# Patient Record
Sex: Female | Born: 1965 | Race: White | Hispanic: No | Marital: Married
Health system: Southern US, Community
[De-identification: ages and names within clinical notes are randomized; demographics above are authoritative.]

---

## 2021-12-28 ENCOUNTER — Other Ambulatory Visit: Payer: Self-pay | Admitting: Family Medicine

## 2021-12-28 DIAGNOSIS — R928 Other abnormal and inconclusive findings on diagnostic imaging of breast: Secondary | ICD-10-CM

## 2022-01-17 ENCOUNTER — Other Ambulatory Visit: Payer: Self-pay | Admitting: Family Medicine

## 2022-01-17 ENCOUNTER — Ambulatory Visit
Admission: RE | Admit: 2022-01-17 | Discharge: 2022-01-17 | Disposition: A | Discharge status: Home / Self Care | Payer: BC Managed Care – PPO | Source: Ambulatory Visit | Attending: Family Medicine | Admitting: Family Medicine

## 2022-01-17 DIAGNOSIS — R928 Other abnormal and inconclusive findings on diagnostic imaging of breast: Secondary | ICD-10-CM

## 2022-01-24 ENCOUNTER — Inpatient Hospital Stay: Payer: BC Managed Care – PPO | Attending: Hematology and Oncology

## 2022-01-24 ENCOUNTER — Telehealth: Payer: Self-pay | Admitting: Genetic Counselor

## 2022-01-24 ENCOUNTER — Other Ambulatory Visit: Payer: Self-pay

## 2022-01-24 ENCOUNTER — Other Ambulatory Visit: Payer: Self-pay | Admitting: Genetic Counselor

## 2022-01-24 ENCOUNTER — Encounter: Payer: Self-pay | Admitting: Genetic Counselor

## 2022-01-24 DIAGNOSIS — Z803 Family history of malignant neoplasm of breast: Secondary | ICD-10-CM

## 2022-01-24 DIAGNOSIS — D0510 Intraductal carcinoma in situ of unspecified breast: Secondary | ICD-10-CM

## 2022-01-24 NOTE — Telephone Encounter (Signed)
Received call from [facility] at Dr. Baker Pierini office about patient needing STAT genetics given recent diagnosis of DCIS and FH of sister with breast cancer in 63s.  Called patient to briefly discuss genetic testing.  Blood draw appt set up for 1:30pm today at [facility].  Genetic counseling appt scheduled for 3/9 at 3pm.  TRF submitted to [facility] for [facility]CAPlus STAT Breast Panel.  Discussed TAT with patient and billing policies of [facility].

## 2022-01-31 ENCOUNTER — Encounter: Payer: Self-pay | Admitting: Genetic Counselor

## 2022-01-31 DIAGNOSIS — Z1379 Encounter for other screening for genetic and chromosomal anomalies: Secondary | ICD-10-CM | POA: Insufficient documentation

## 2022-02-03 ENCOUNTER — Telehealth: Payer: Self-pay | Admitting: Genetic Counselor

## 2022-02-03 NOTE — Telephone Encounter (Addendum)
Revealed negative breast cancer gene STAT results.  ? ? ?

## 2022-02-03 NOTE — Telephone Encounter (Deleted)
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   Arimidex x 5 years, managed by Dr Schwab  Low oncotype - 15    S/P 06/30/22 Left Breast Lumpectomy, sentinel lymph node biopsy     Established with rad onc, Dr Yashar    FINAL PATHOLOGIC DIAGNOSIS:   A: Left breast, lumpectomy:   -Invasive ductal carcinoma, histologic grade 3, rpT1cN0(sn), see synoptic report.   -Final resection margins, inclusive of specimen B  C, are negative for carcinoma.   B: Left breast, true deep margin, excision:   -Benign breast tissue.   C: Left breast, true margin at site of cancer, excision:   -Benign breast tissue.   D: Left axillary sentinel lymph nodes, excision:   -Two lymph nodes, negative for carcinoma (0/2).   BREAST CANCER: SYNOPTIC REPORT:   (CAP Version 4.8.0.0, posted December 2022, based on AJCC 8th Edition)   Specimen/Procedure: Excision (less than total mastectomy)   Lymph Node Sampling: Sentinel lymph node(s)   Specimen Laterality: Left   Tumor Focality: Unifocal   Invasive Tumor Type: Invasive carcinoma of no special type (ductal)   Invasive Tumor Size: 20 mm (maximum diameter)   Histologic Grade (Nottingham modification, Scarf-Bloom-Richardson System):   Grade 3   Nuclear Pleomorphism: Score 3   Mitotic rate: Score 2   Glandular/Tubular Differentiation: Score 3   Total score: 8/9   Lymphovascular Invasion: Absent   Resection Margins For Invasive Tumor:   Margins uninvolved by invasive carcinoma   Distance from closest margin: Widely clear -greater than 10 mm; including specimen(s) B  C, which is negative for carcinoma   Duct Carcinoma In Situ (DCIS): Not identified   Lobular Carcinoma In Situ (LCIS): Not identified   Lymph  Nodes Positive /Total Lymph Nodes Sampled: 0/2   Number of lymph nodes with macrometastases: 0   Number of lymph nodes with micrometastases:0   Number of Lymph Nodes with Isolated Tumor Cells: 0   Treatment Effect(s): Response to Presurgical (Neoadjuvant) Therapy in the   Breast:       Not applicable (no known presurgical therapy)   Pathologic Stage Classification:   r (recurrence)   Primary Tumor (Invasive Carcinoma) (pT):   pT1c:     Tumor >10 mm but less than or equal to 20 mm in greatest   dimension   Regional Lymph Nodes (pN):   (sn):     Sentinel node(s) evaluated.   pN0: No regional lymph node metastasis identified or ITCs only   Nipple: Not applicable   Skin/dermis: Invasive carcinoma directly invades into the dermis without   skin ulceration   Skeletal Muscle: Not applicable   Breast biomarker testing   -Testing was performed and results were reported on a prior biopsy   ([id number]); results are outlined again below   Estrogen receptor (ER): Positive:   Percentage of cells with nuclear positivity: 90%   Average intensity of staining: Strong   Progesterone receptor (PR): Positive:   Percentage of cells with nuclear positivity: 50%   Average intensity of staining: Strong   HER2 (IHC): Negative (score 1+, ASCO/CAP 2018)   HER2 FISH: Negative     Symptom-Focused Nursing Assessment: Pt returns to clinic   for post op check/path review. Started first partial breast XRT treatment this morning.      Fall Risk             Fall Risk Interventions:   {Fall Risk Interventions:37425}     Education provided to patient/caregiver on fall risk factors and precautions.      Nutrition        Nutrition Interventions:  {Nutrition Interventions:37463}     Education provided on importance of early nutrition intervention for improved symptom management and outcomes, with in-person or telehealth visit and family involvement.     Wellbeing Screening    Screening      No questionnaires available.                                 {Wellbeing Screening Interventions:37464}    {Oral Cancer-Directed Therapy:37465}    Pain        Pain interventions:    {Pain Interventions:37466}    Language: English           Nursing Education: Educated on plan of care, office contact, and AVS using teach-back method.     Teaching outcomes:      {Teaching Outcomes:37467}     Patient/caregiver expressed understanding of plan of care and questions answered to patient satisfaction     {Education Materials Provided:37468}    Plan of care: ***

## 2022-02-06 ENCOUNTER — Inpatient Hospital Stay: Payer: BC Managed Care – PPO | Attending: Hematology and Oncology | Admitting: Genetic Counselor

## 2022-02-06 ENCOUNTER — Other Ambulatory Visit: Payer: Self-pay

## 2022-02-06 DIAGNOSIS — Z803 Family history of malignant neoplasm of breast: Secondary | ICD-10-CM

## 2022-02-06 DIAGNOSIS — Z1379 Encounter for other screening for genetic and chromosomal anomalies: Secondary | ICD-10-CM

## 2022-02-06 DIAGNOSIS — D0512 Intraductal carcinoma in situ of left breast: Secondary | ICD-10-CM | POA: Diagnosis not present

## 2022-02-06 NOTE — Progress Notes (Incomplete)
REFERRING PROVIDER: Pollyann Samples, MD [address],  [place] [address]    PRIMARY PROVIDER:  Nicoletta Dress, MD  PRIMARY REASON FOR VISIT:  No diagnosis found.   HISTORY OF PRESENT ILLNESS:   Jean Rogers, a 56 y.o. female, was seen for a [facility] cancer genetics consultation at the request of Dr. Lilia Pro due to a personal and family history of cancer.  Jean Rogers presents to clinic today to discuss the possibility of a hereditary predisposition to cancer, to discuss genetic testing, and to further clarify her future cancer risks, as well as potential cancer risks for family members.   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 The treatment plan is pending.    RISK FACTORS:  Mammogram within the last year: yes Colonoscopy: yes;  most recent in January 2020 . Hysterectomy: yes; removed at 38; fibroids Ovaries intact: yes.  Menarche was at age 51 or 53.  First live birth at age 15.  OCP use for approximately 6 years.  HRT use: 0 years. Any excessive radiation exposure in the past: no Dermatology: as needed  FAMILY HISTORY:  We obtained a detailed, 4-generation family history.  Significant diagnoses are listed below: Family History  Problem Relation Age of Onset   Breast cancer Sister        dx 44s    Ms. Inzer is {aware/unaware} of previous family history of genetic testing for hereditary cancer risks. Patient's maternal ancestors are of *** descent, and paternal ancestors are of *** descent. There {IS NO:12509} reported Ashkenazi Jewish ancestry. There {IS NO:12509} known consanguinity.  GENETIC COUNSELING ASSESSMENT: Jean Rogers is a 56 y.o. female with a {Personal/family:20331} history of {cancer/polyps} which is somewhat suggestive of a {DISEASE} and predisposition to cancer given ***. We, therefore, discussed and recommended the following at today's visit.    DISCUSSION: We discussed that *** - ***% of *** is hereditary.  Most cases of *** associated with ***.  There are other genes that can be associated with hereditary *** cancer syndromes.  These include ***.  We discussed that testing is beneficial for several reasons including knowing how to follow individuals for their cancer risks, identifying whether potential treatment options *** would be beneficial, and understanding if other family members could be at risk for cancer and allowing them to undergo genetic testing.   We reviewed the characteristics, features and inheritance patterns of hereditary cancer syndromes. We also discussed genetic testing, including the appropriate family members to test, the process of testing, insurance coverage and turn-around-time for results. We discussed the implications of a negative, positive, carrier and/or variant of uncertain significant result. We recommended Jean Rogers pursue genetic testing for a panel that includes genes associated with *** cancer.   Jean Rogers  was offered a common hereditary cancer panel (47 genes) and an expanded pan-cancer panel (77 genes). Jean Rogers was informed of the benefits and limitations of each panel, including that expanded pan-cancer panels contain genes that do not have clear management guidelines at this point in time.  We also discussed that as the number of genes included on a panel increases, the chances of variants of uncertain significance increases.  After considering the benefits and limitations of each gene panel, Jean Rogers  elected to have *** through ***.   Based on Jean Rogers's {Personal/family:20331} history of cancer, she meets medical  criteria for genetic testing. Despite that she meets criteria, she may still have an out of pocket cost. We discussed that if her out of pocket cost for testing is over $100, the laboratory should contact her and discuss the self-pay prices and/or patient pay assistance programs.     ***We reviewed the characteristics, features and inheritance patterns of hereditary cancer syndromes. We also discussed genetic testing, including the appropriate family members to test, the process of testing, insurance coverage and turn-around-time for results. We discussed the implications of a negative, positive and/or variant of uncertain significant result. In order to get genetic test results in a timely manner so that Jean Rogers can use these genetic test results for surgical decisions, we recommended Jean Rogers pursue genetic testing for the ***. Once complete, we recommend Jean Rogers pursue reflex genetic testing to the *** gene panel.   Based on Jean Rogers's {Personal/family:20331} history of cancer, she meets medical criteria for genetic testing. Despite that she meets criteria, she may still have an out of pocket cost.   ***We discussed with Jean Rogers that the {Personal/family:20331} history does not meet insurance or NCCN criteria for genetic testing and, therefore, is not highly consistent with a familial hereditary cancer syndrome.  We feel she is at low risk to harbor a gene mutation associated with such a condition. Thus, we did not recommend any genetic testing, at this time, and recommended Jean Rogers continue to follow the cancer screening guidelines given by her primary healthcare provider.  ***In order to estimate her chance of having a {CA GENE:62345} mutation, we used statistical models ({GENMODELS:62370}) that consider her personal medical history, family history and ancestry.  Because each model is different, there can be a lot of variability in the risks they give.  Therefore, these numbers must be considered a rough range and not a precise risk of having a {CA GENE:62345} mutation.  These models estimate that she has approximately a ***-***% chance of having a mutation. Based on this assessment of her family and personal history, genetic testing {IS/ISNOT:34056}  recommended.  ***Based on the patient's {Personal/family:20331} history, a statistical model ({GENMODELS:62370}) was used to estimate her risk of developing {CA HX:54794}. This estimates her lifetime risk of developing {CA HX:54794} to be approximately ***%. This estimation does not consider any genetic testing results.  The patient's lifetime breast cancer risk is a preliminary estimate based on available information using one of several models endorsed by the [place] (ACS). The ACS recommends consideration of breast MRI screening as an adjunct to mammography for patients at high risk (defined as 20% or greater lifetime risk).   ***Jean Rogers has been determined to be at high risk for breast cancer.  Therefore, we recommend that annual screening with mammography and breast MRI be performed.  ***begin at age 43, or 10 years prior to the age of breast cancer diagnosis in a relative (whichever is earlier).  We discussed that Jean Rogers should discuss her individual situation with her referring physician and determine a breast cancer screening plan with which they are both comfortable.    PLAN: After considering the risks, benefits, and limitations, Jean Rogers provided informed consent to pursue genetic testing and the blood sample was sent to {Lab} Laboratories for analysis of the {test}. Results should be available within approximately {TAT TIME} weeks' time, at which point they will be disclosed by telephone to Ms. Bakken, as will any additional recommendations warranted by these results. Ms. Kinder will receive a summary  of her genetic counseling visit and a copy of her results once available. This information will also be available in Epic.   *** Despite our recommendation, Ms. Goedken did not wish to pursue genetic testing at today's visit. We understand this decision and remain available to coordinate genetic testing at any time in the future. We, therefore, recommend Ms. Portee continue to  follow the cancer screening guidelines given by her primary healthcare provider.  ***Based on Ms. Stearns's family history, we recommended her ***, who was diagnosed with *** at age ***, have genetic counseling and testing. Ms. Serrette will let us know if we can be of any assistance in coordinating genetic counseling and/or testing for this family member.   Lastly, we encouraged Ms. Pang to remain in contact with cancer genetics annually so that we can continuously update the family history and inform her of any changes in cancer genetics and testing that may be of benefit for this family.   Ms. Albee questions were answered to her satisfaction today. Our contact information was provided should additional questions or concerns arise. Thank you for the referral and allowing [place] to share in the care of your patient.   Keiara Sneeringer M. Joette Catching, [place], [facility] Genetic Counselor [email or web address] (P) [phone]  The patient was seen for a total of *** minutes in face-to-face genetic counseling.  ***The was accompanied by ***.  ***The patient was seen alone.  Drs. Lindi Adie and/or Burr Medico were available to discuss this case as needed.    _______________________________________________________________________ For Office Staff:  Number of people involved in session: *** Was an Intern/ student involved with case: {YES/NO:63}

## 2022-02-11 ENCOUNTER — Encounter: Payer: Self-pay | Admitting: Genetic Counselor

## 2022-02-11 DIAGNOSIS — D0512 Intraductal carcinoma in situ of left breast: Secondary | ICD-10-CM | POA: Insufficient documentation

## 2022-02-11 DIAGNOSIS — Z803 Family history of malignant neoplasm of breast: Secondary | ICD-10-CM | POA: Insufficient documentation

## 2022-02-11 HISTORY — DX: Intraductal carcinoma in situ of left breast: D05.12

## 2022-02-11 HISTORY — DX: Family history of malignant neoplasm of breast: Z80.3

## 2022-02-14 ENCOUNTER — Telehealth: Payer: Self-pay | Admitting: Genetic Counselor

## 2022-02-14 ENCOUNTER — Ambulatory Visit: Payer: Self-pay | Admitting: Genetic Counselor

## 2022-02-14 DIAGNOSIS — Z1379 Encounter for other screening for genetic and chromosomal anomalies: Secondary | ICD-10-CM

## 2022-02-14 DIAGNOSIS — D0512 Intraductal carcinoma in situ of left breast: Secondary | ICD-10-CM

## 2022-02-14 DIAGNOSIS — Z803 Family history of malignant neoplasm of breast: Secondary | ICD-10-CM

## 2022-02-14 NOTE — Telephone Encounter (Signed)
Revealed negative pan-cancer panel.    

## 2022-02-14 NOTE — Progress Notes (Signed)
HPI:   ?Ms. Tello was previously seen in the [facility] clinic due to a personal and family history of cancer and concerns regarding a hereditary predisposition to cancer. Please refer to our prior cancer genetics clinic note for more information regarding our discussion, assessment and recommendations, at the time. Ms. Lamarca recent genetic test results were disclosed to her, as were recommendations warranted by these results. These results and recommendations are discussed in more detail below. ? ?CANCER HISTORY:  ?Oncology History  ?Ductal carcinoma in situ (DCIS) of left breast  ?02/11/2022 Initial Diagnosis  ? Ductal carcinoma in situ (DCIS) of left breast ?  ?02/11/2022 Genetic Testing  ? Negative hereditary cancer genetic testing: no pathogenic variants detected in [facility]CAPl[facility] Panel and CustomNext-Cancer +RNAinsight Panel.  Report dates are January 30, 2022 and February 11, 2022.  ? ?The BRCAplus panel offered by [employer] and includes sequencing and deletion/duplication analysis for the following 8 genes: ATM, BRCA1, BRCA2, CDH1, CHEK2, PALB2, PTEN, and TP53.  The CustomNext-Cancer +RNAinsight Panel includes analysis of the following 39 genes: APC, ATM, BAP1, BARD1, BMPR1A, BRCA1, BRCA2, BRIP1, CDH1, CDK4, CDKN2A, CHEK2, DICER1, MLH1,MSH2, MSH6, MUTYH, NBN, NF1, NTHL1, PALB2, PMS2, POT1, PTEN, RAD51C, RAD51D, RECQL, SMAD4, SMARCA4, STK11 and TP53 (sequencing and deletion/duplication); AXIN2, HOXB13, MITF, MSH3, POLD1 and POLE (sequencing only); EPCAM and GREM1 ?(deletion/duplication only). RNA data is routinely analyzed for use in variant interpretation for all genes. ?  ? ? ?FAMILY HISTORY:  ?We obtained a detailed, 4-generation family history.  Significant diagnoses are listed below: ?Family History  ?Problem Relation Age of Onset  ? Throat cancer Mother   ?     dx 61s  ? Head & neck cancer Father   ?     dx early 73s  ? Breast cancer Sister   ?     dx 34s  ? Cancer Sister 62  ?      salivary gland  ? Brain cancer Maternal Aunt   ?     dx 39s-80s  ? ? ? ?Ms. Fogarty is unaware of previous family history of genetic testing for hereditary cancer risks.  She reports that her sister may have had negative genetic testing previously; however, no report was available for review today. Other affected family members were unavailable for testing at this point in time. There is no reported Ashkenazi Jewish ancestry. There is no known consanguinity. ? ?GENETIC TEST RESULTS:  ?The [facility] CustomNext-Cancer +RNAinsight Panel found no pathogenic mutations. The CustomNext-Cancer+RNAinsight panel offered by [facility] includes sequencing and rearrangement analysis for the following 39 genes:  APC, ATM, BAP1, BARD1, BMPR1A, BRCA1, BRCA2, BRIP1, CDH1, CDK4, CDKN2A, CHEK2, DICER1, MLH1,MSH2, MSH6, MUTYH, NBN, NF1, NTHL1, PALB2, PMS2, POT1, PTEN, RAD51C, RAD51D, RECQL, SMAD4, SMARCA4, STK11 and TP53 (sequencing and deletion/duplication); AXIN2, HOXB13, MITF, MSH3, POLD1 and POLE (sequencing only); EPCAM and GREM1 (deletion/duplication only).  RNA data is routinely analyzed for use in variant interpretation for all genes. ? ?The test report has been scanned into EPIC and is located under the Molecular Pathology section of the Results Review tab.  A portion of the result report is included below for reference. Genetic testing reported out on February 11, 2022. ? ? ? ?Even though a pathogenic variant was not identified, possible explanations for the cancer in the family may include: ?There may be no hereditary risk for cancer in the family. The cancers in Ms. Bettinger and/or her family may be sporadic/familial or due to other genetic and environmental factors. ?  There may be a gene mutation in one of these genes that current testing methods cannot detect but that chance is small. ?There could be another gene that has not yet been discovered, or that we have not yet tested, that is responsible for the cancer diagnoses in the  family.  ?It is also possible there is a hereditary cause for the cancer in the family that Ms. Montini did not inherit. ? ?Therefore, it is important to remain in touch with cancer genetics in the future so that we can continue to offer Ms. Mckeag the most up to date genetic testing.  ? ?ADDITIONAL GENETIC TESTING:  ?We discussed with Ms. Pellicano that her genetic testing was fairly extensive.  If there are genes identified to increase cancer risk that can be analyzed in the future, we would be happy to discuss and coordinate this testing at that time.   ? ?CANCER SCREENING RECOMMENDATIONS:  ?Ms. Hinks's test result is considered negative (normal).  This means that we have not identified a hereditary cause for her personal history of cancer at this time.  ? ?An individual's cancer risk and medical management are not determined by genetic test results alone. Overall cancer risk assessment incorporates additional factors, including personal medical history, family history, and any available genetic information that may result in a personalized plan for cancer prevention and surveillance. Therefore, it is recommended she continue to follow the cancer management and screening guidelines provided by her oncology and primary healthcare provider. ? ?RECOMMENDATIONS FOR FAMILY MEMBERS:   ?Since she did not inherit a identifiable mutation in a cancer predisposition gene included on this panel, her children could not have inherited a known mutation from her in one of these genes. ?Individuals in this family might be at some increased risk of developing cancer, over the general population risk, due to the family history of cancer.  Individuals in the family should notify their providers of the family history of cancer. We recommend women in this family have a yearly mammogram beginning at age 33, or 103 years younger than the earliest onset of cancer, an annual clinical breast exam, and perform monthly breast self-exams.  .   ? ? ?FOLLOW-UP:  ?Lastly, we discussed with Ms. Froberg that cancer genetics is a rapidly advancing field and it is possible that new genetic tests will be appropriate for her and/or her family members in the future. We encouraged her to remain in contact with cancer genetics on an annual basis so we can update her personal and family histories and let her know of advances in cancer genetics that may benefit this family.  ? ?Our contact number was provided. Ms. Shivley questions were answered to her satisfaction, and she knows she is welcome to call us at anytime with additional questions or concerns.  ? ?Dezaree Tracey M. Joette Catching, [place], [place] ?Genetic Counselor ?[email or web address] ?(P) [phone] ? ?

## 2023-02-20 IMAGING — MG MM BREAST BX W LOC DEV 1ST LESION IMAGE BX SPEC STEREO GUIDE*L*
8 of 11 series · 8 of 19 positions shown · non-contrast
Comparison: Previous exams.
COMPARISON: Previous exams.

Addendum:
CLINICAL DATA: Patient presents for stereotactic core needle biopsy
of a possible architectural distortion in the posterior, slightly
medial aspect the left breast.

EXAM:
LEFT BREAST STEREOTACTIC CORE NEEDLE BIOPSY

[L (1 of 8)]
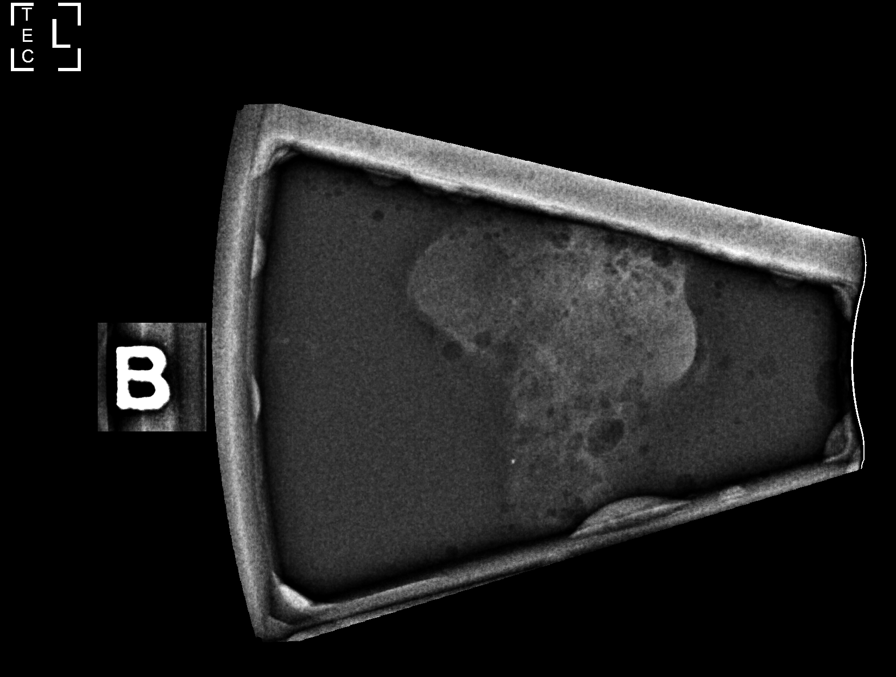

[L (2 of 8)]
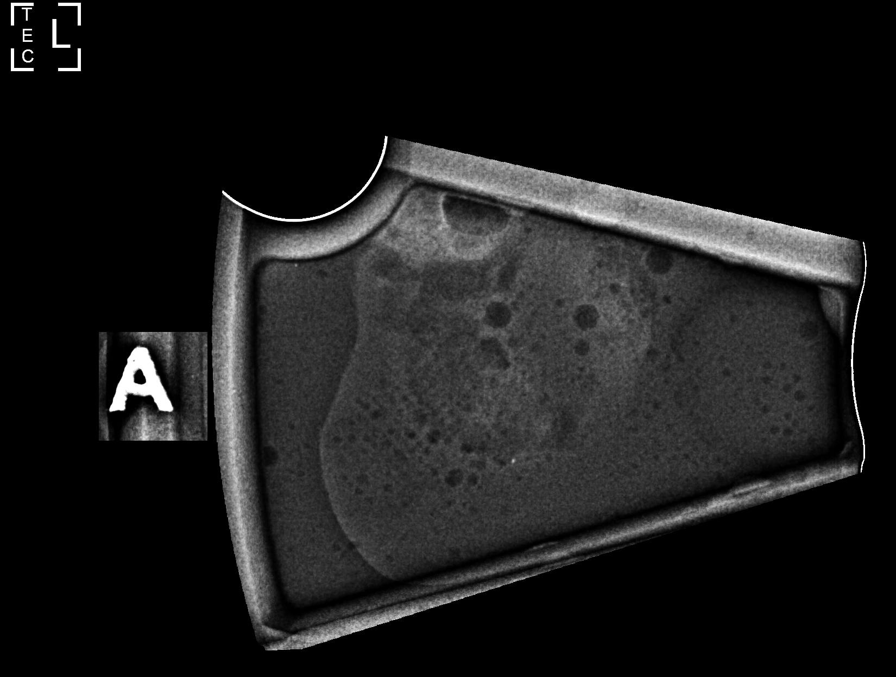

[L (3 of 8)]
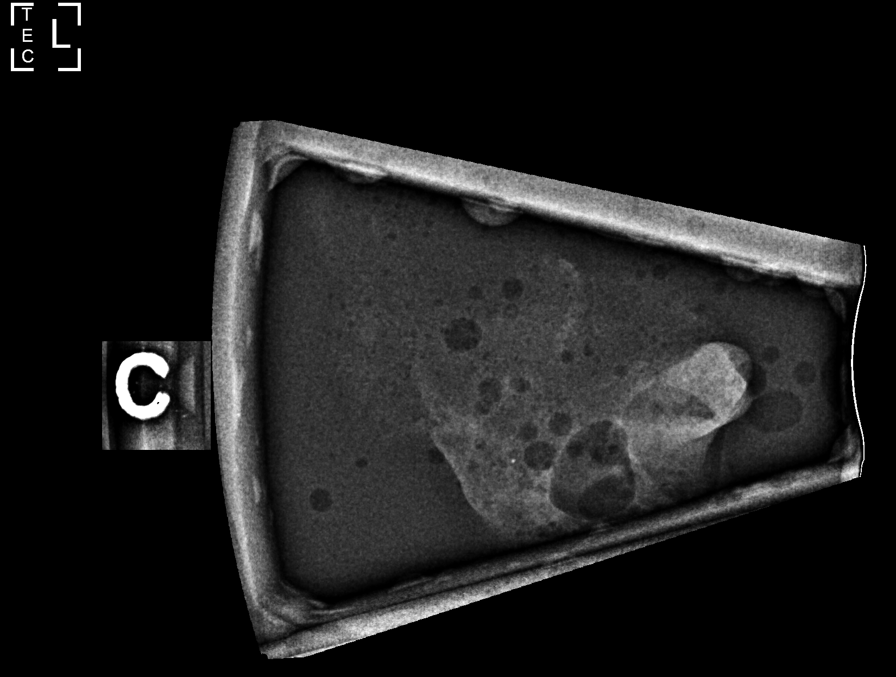

[L (4 of 8)]
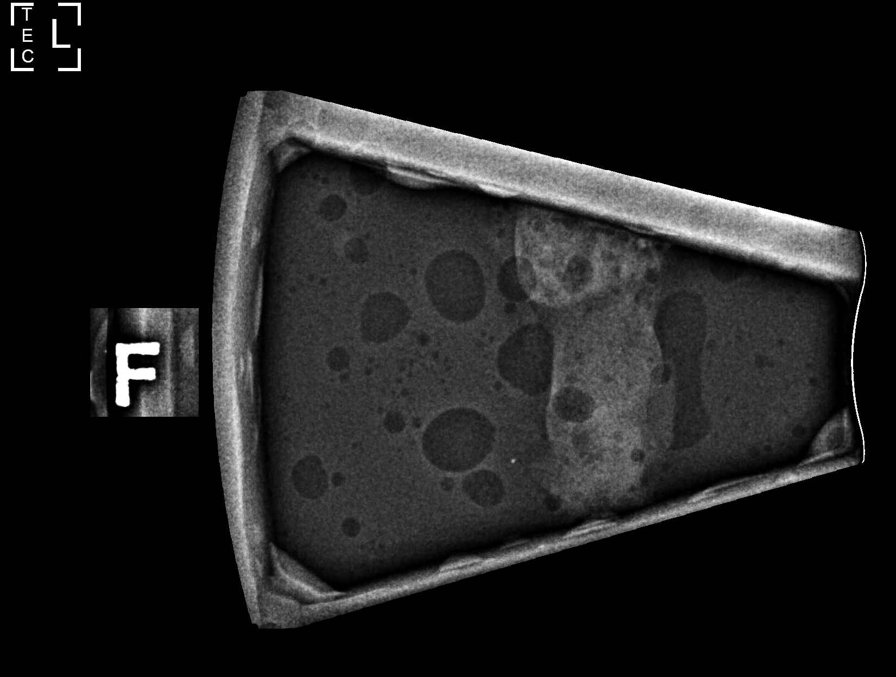

[L (5 of 8)]
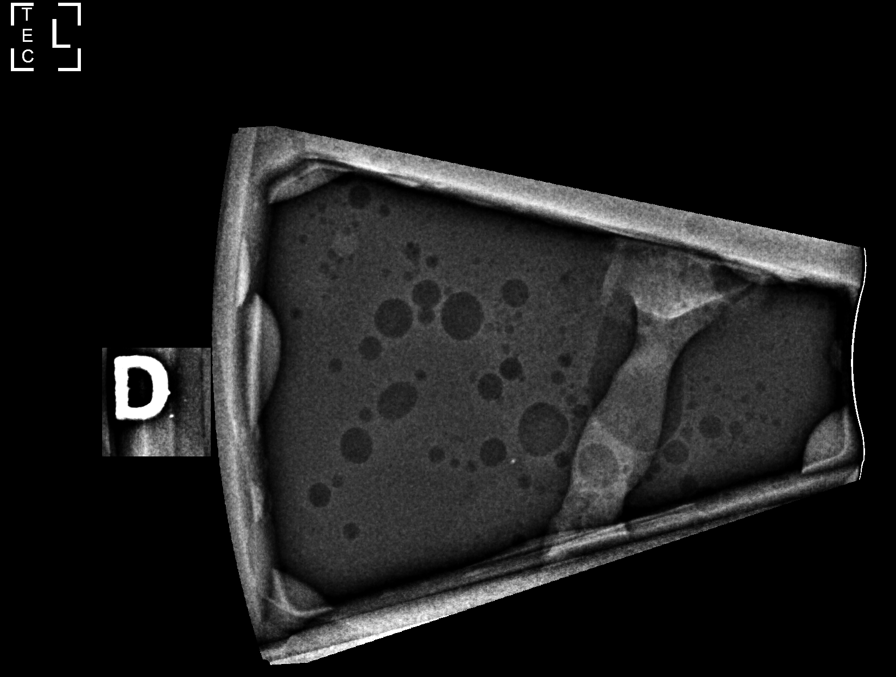

[L (6 of 8)]
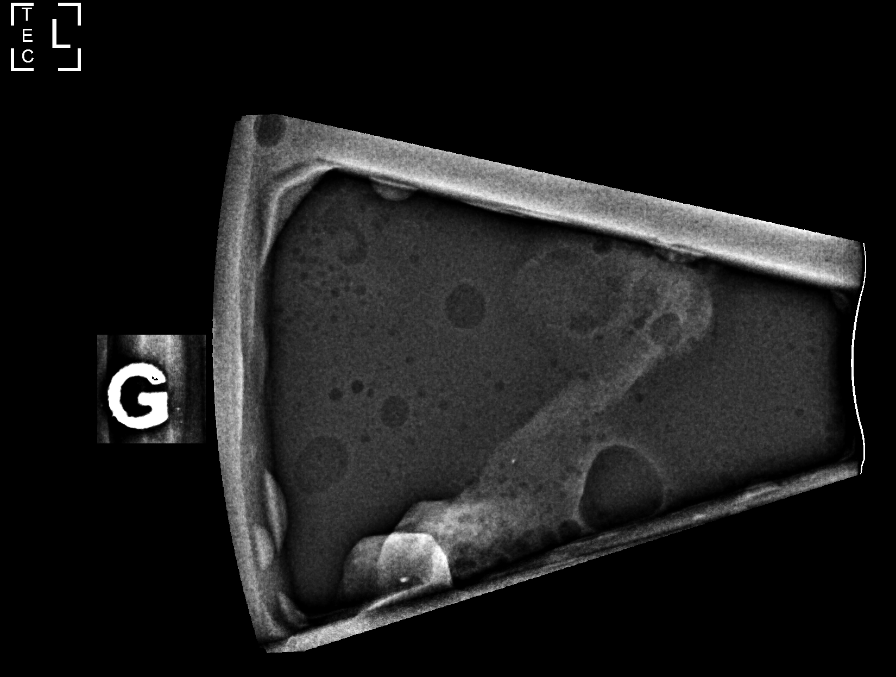

[L (7 of 8)]
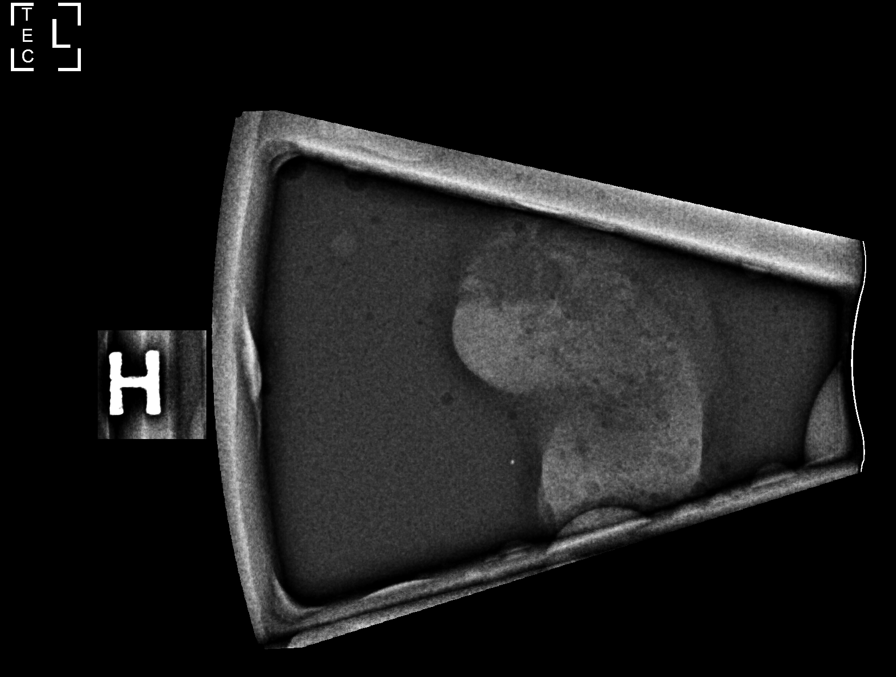

[L (8 of 8)]
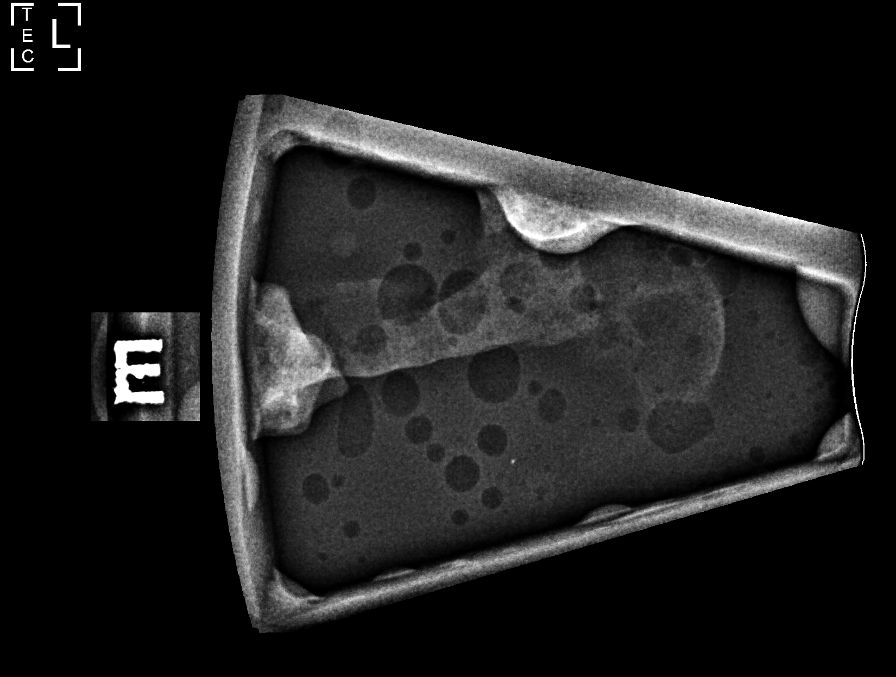

[8 of 19 positions shown; findings below may reference images not displayed]



Using sterile technique and 1% Lidocaine as local anesthetic, under
stereotactic guidance, a 9 gauge vacuum assisted device was used to
perform core needle biopsy of the apparent distortion in the
posterior slightly medial left breast using a superior approach.

Lesion quadrant: Upper inner quadrant

At the conclusion of the procedure, a coil shaped tissue marker clip
was deployed into the biopsy cavity. Follow-up 2-view mammogram was
performed and dictated separately.
IMPRESSION: Stereotactic-guided biopsy of of an apparent left breast
architectural distortion. No apparent complications.

ADDENDUM:
Pathology revealed HIGH-GRADE DUCTAL CARCINOMA IN SITU, CLINGING AND
CRIBRIFORM TYPES WITH FOCAL NECROSIS AND CANCERIZATION OF ADENOSIS,
BACKGROUND EXTENSIVE STROMAL FIBROSIS AND SCLEROSING ADENOSIS WITH
FIBROMATOID CHANGE, MICROCALCIFICATIONS PRESENT WITHIN ADENOSIS of
the LEFT breast, posterior and slightly medial, (coil clip). This
was found to be concordant by Dr. Paavali Dromberg.

Pathology results were discussed with the patient by telephone. The
patient reported doing well after the biopsy with minimal tenderness
at the site. Post biopsy instructions and care were reviewed and
questions were answered. The patient was encouraged to call The
direct phone number was provided.

Surgical consultation has been arranged with Dr. Nelz Ode at
[REDACTED], [HOSPITAL] location, on January 24, 2022.

Consideration for a bilateral breast MRI for further evaluation of
extent of disease given the high grade histology.

Pathology results reported by Vangie Deramus, RN on 01/21/2022.



Using sterile technique and 1% Lidocaine as local anesthetic, under
stereotactic guidance, a 9 gauge vacuum assisted device was used to
perform core needle biopsy of the apparent distortion in the
posterior slightly medial left breast using a superior approach.

Lesion quadrant: Upper inner quadrant

At the conclusion of the procedure, a coil shaped tissue marker clip
was deployed into the biopsy cavity. Follow-up 2-view mammogram was
performed and dictated separately.
IMPRESSION: Stereotactic-guided biopsy of of an apparent left breast
architectural distortion. No apparent complications.
